# Patient Record
Sex: Male | Born: 1990 | Race: Black or African American | Hispanic: No | Marital: Single | Smoking: Never smoker
Health system: Southern US, Community
[De-identification: ages and names within clinical notes are randomized; demographics above are authoritative.]

## PROBLEM LIST (undated history)

## (undated) DIAGNOSIS — Z8619 Personal history of other infectious and parasitic diseases: Secondary | ICD-10-CM

## (undated) HISTORY — DX: Personal history of other infectious and parasitic diseases: Z86.19

## (undated) HISTORY — PX: NO PAST SURGERIES: SHX2092

---

## 2011-11-17 ENCOUNTER — Emergency Department (HOSPITAL_COMMUNITY)
Admission: EM | Admit: 2011-11-17 | Discharge: 2011-11-17 | Disposition: A | Discharge status: Home / Self Care | Payer: No Typology Code available for payment source | Attending: Emergency Medicine | Admitting: Emergency Medicine

## 2011-11-17 ENCOUNTER — Emergency Department (HOSPITAL_COMMUNITY): Payer: No Typology Code available for payment source

## 2011-11-17 ENCOUNTER — Encounter (HOSPITAL_COMMUNITY): Payer: Self-pay | Admitting: Emergency Medicine

## 2011-11-17 DIAGNOSIS — R51 Headache: Secondary | ICD-10-CM | POA: Insufficient documentation

## 2011-11-17 DIAGNOSIS — Z23 Encounter for immunization: Secondary | ICD-10-CM | POA: Insufficient documentation

## 2011-11-17 DIAGNOSIS — S0180XA Unspecified open wound of other part of head, initial encounter: Secondary | ICD-10-CM | POA: Insufficient documentation

## 2011-11-17 DIAGNOSIS — S0181XA Laceration without foreign body of other part of head, initial encounter: Secondary | ICD-10-CM

## 2011-11-17 DIAGNOSIS — H53149 Visual discomfort, unspecified: Secondary | ICD-10-CM | POA: Insufficient documentation

## 2011-11-17 MED ORDER — FLUORESCEIN SODIUM 1 MG OP STRP
1.0000 | ORAL_STRIP | Freq: Once | OPHTHALMIC | Status: AC
  Administered 2011-11-17: 1 via OPHTHALMIC
  Filled 2011-11-17: qty 1

## 2011-11-17 MED ORDER — HYDROCODONE-ACETAMINOPHEN 5-325 MG PO TABS
1.0000 | ORAL_TABLET | Freq: Once | ORAL | Status: AC
  Administered 2011-11-17: 1 via ORAL
  Filled 2011-11-17: qty 1

## 2011-11-17 MED ORDER — ONDANSETRON 8 MG PO TBDP
8.0000 mg | ORAL_TABLET | Freq: Once | ORAL | Status: AC
  Administered 2011-11-17: 8 mg via ORAL
  Filled 2011-11-17: qty 1

## 2011-11-17 MED ORDER — TETRACAINE HCL 0.5 % OP SOLN
2.0000 [drp] | Freq: Once | OPHTHALMIC | Status: AC
  Administered 2011-11-17: 2 [drp] via OPHTHALMIC

## 2011-11-17 MED ORDER — TETANUS-DIPHTH-ACELL PERTUSSIS 5-2.5-18.5 LF-MCG/0.5 IM SUSP
0.5000 mL | Freq: Once | INTRAMUSCULAR | Status: AC
  Administered 2011-11-17: 0.5 mL via INTRAMUSCULAR
  Filled 2011-11-17: qty 0.5
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
## 2011-11-17 NOTE — ED Notes (Signed)
Report received, airway intact-no s/s's of distress-will continue to monitor 
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
## 2012-01-21 ENCOUNTER — Encounter (HOSPITAL_COMMUNITY): Payer: Self-pay | Admitting: Emergency Medicine

## 2012-01-21 ENCOUNTER — Emergency Department (HOSPITAL_COMMUNITY)
Admission: EM | Admit: 2012-01-21 | Discharge: 2012-01-21 | Disposition: A | Discharge status: Home / Self Care | Payer: Self-pay | Attending: Emergency Medicine | Admitting: Emergency Medicine

## 2012-01-21 DIAGNOSIS — R110 Nausea: Secondary | ICD-10-CM | POA: Insufficient documentation

## 2012-01-21 DIAGNOSIS — K5289 Other specified noninfective gastroenteritis and colitis: Secondary | ICD-10-CM | POA: Insufficient documentation

## 2012-01-21 DIAGNOSIS — R197 Diarrhea, unspecified: Secondary | ICD-10-CM | POA: Insufficient documentation

## 2012-01-21 DIAGNOSIS — K529 Noninfective gastroenteritis and colitis, unspecified: Secondary | ICD-10-CM

## 2012-01-21 LAB — POCT I-STAT, CHEM 8
BUN: 8 mg/dL (ref 6–23)
Calcium, Ion: 1.21 mmol/L (ref 1.12–1.23)
Chloride: 103 mEq/L (ref 96–112)
Creatinine, Ser: 1.2 mg/dL (ref 0.50–1.35)
Glucose, Bld: 86 mg/dL (ref 70–99)
HCT: 41 % (ref 39.0–52.0)
Hemoglobin: 13.9 g/dL (ref 13.0–17.0)
Potassium: 3.5 mEq/L (ref 3.5–5.1)
Sodium: 141 mEq/L (ref 135–145)
TCO2: 28 mmol/L (ref 0–100)

## 2012-01-21 LAB — CBC WITH DIFFERENTIAL/PLATELET
Basophils Absolute: 0.1 10*3/uL (ref 0.0–0.1)
Basophils Relative: 1 % (ref 0–1)
Eosinophils Absolute: 0.3 10*3/uL (ref 0.0–0.7)
Eosinophils Relative: 3 % (ref 0–5)
HCT: 39.2 % (ref 39.0–52.0)
Hemoglobin: 13.3 g/dL (ref 13.0–17.0)
Lymphocytes Relative: 17 % (ref 12–46)
Lymphs Abs: 1.5 10*3/uL (ref 0.7–4.0)
MCH: 28.7 pg (ref 26.0–34.0)
MCHC: 33.9 g/dL (ref 30.0–36.0)
MCV: 84.7 fL (ref 78.0–100.0)
Monocytes Absolute: 1.4 10*3/uL — ABNORMAL HIGH (ref 0.1–1.0)
Monocytes Relative: 16 % — ABNORMAL HIGH (ref 3–12)
Neutro Abs: 5.4 10*3/uL (ref 1.7–7.7)
Neutrophils Relative %: 63 % (ref 43–77)
Platelets: 230 10*3/uL (ref 150–400)
RBC: 4.63 MIL/uL (ref 4.22–5.81)
RDW: 13.2 % (ref 11.5–15.5)
WBC: 8.7 10*3/uL (ref 4.0–10.5)

## 2012-01-21 MED ORDER — HYDROCODONE-ACETAMINOPHEN 5-325 MG PO TABS: 2.0000 | ORAL_TABLET | ORAL | Status: DC | PRN

## 2012-01-21 MED ORDER — DIPHENOXYLATE-ATROPINE 2.5-0.025 MG PO TABS: 1.0000 | ORAL_TABLET | Freq: Four times a day (QID) | ORAL | Status: DC | PRN

## 2012-01-21 NOTE — ED Provider Notes (Addendum)
History     CSN: [id number]  Arrival date & time 01/21/12  1417   First MD Initiated Contact with Patient 01/21/12 1508      Chief Complaint  Patient presents with  . Abdominal Pain  . Diarrhea     HPI Pt from home c/o abdominal cramping and diarrhea x1 weeks. Pt states he can eat and drink, no emesis. Pt reports he has "stomach flu" on Monday, but diarrhea has not gone away. Pt in NAD and A&O  History reviewed. No pertinent past medical history.  History reviewed. No pertinent past surgical history.  No family history on file.  History  Substance Use Topics  . Smoking status: Never Smoker   . Smokeless tobacco: Never Used  . Alcohol Use: Yes     Comment: occasionally      Review of Systems All other systems reviewed and are negative Allergies  Penicillins  Home Medications   Current Outpatient Rx  Name  Route  Sig  Dispense  Refill  . DIPHENOXYLATE-ATROPINE 2.5-0.025 MG PO TABS   Oral   Take 1 tablet by mouth 4 (four) times daily as needed for diarrhea or loose stools.   30 tablet   0   . HYDROCODONE-ACETAMINOPHEN 5-325 MG PO TABS   Oral   Take 2 tablets by mouth every 4 (four) hours as needed for pain.   10 tablet   0     BP 105/66  Pulse 78  Temp 98.7 F (37.1 C) (Oral)  Resp 16  SpO2 100%  Physical Exam  Nursing note and vitals reviewed. Constitutional: He is oriented to person, place, and time. He appears well-developed and well-nourished. No distress.  HENT:  Head: Normocephalic and atraumatic.  Eyes: Pupils are equal, round, and reactive to light.  Neck: Normal range of motion.  Cardiovascular: Normal rate and intact distal pulses.   Pulmonary/Chest: No respiratory distress.  Abdominal: Normal appearance. He exhibits no distension. There is no tenderness. There is no rebound and no guarding.  Musculoskeletal: Normal range of motion.  Neurological: He is alert and oriented to person, place, and time. No cranial nerve deficit.  Skin:  Skin is warm and dry. No rash noted.  Psychiatric: He has a normal mood and affect. His behavior is normal.    ED Course  Procedures (including critical care time)   Labs Reviewed  CBC WITH DIFFERENTIAL  POCT I-STAT, CHEM 8   No results found.   1. Gastroenteritis       MDM         Nelia Shi, MD 01/21/12 1640  Nelia Shi, MD 01/21/12 [phone]

## 2012-01-21 NOTE — ED Notes (Signed)
Pt from home c/o abdominal cramping and diarrhea x1 weeks. Pt states he can eat and drink, no emesis. Pt reports he has "stomach flu" on Monday, but diarrhea has not gone away. Pt in NAD and A&O

## 2012-01-21 NOTE — ED Notes (Addendum)
Pt states he has been having abd cramping/diarrhea since Monday. Was having NV until Tuesday but now is just having nausea.  Pain 10/10.  Pt does not appear to be in any distress.

## 2012-03-03 ENCOUNTER — Encounter (HOSPITAL_COMMUNITY): Payer: Self-pay | Admitting: *Deleted

## 2012-03-03 ENCOUNTER — Emergency Department (HOSPITAL_COMMUNITY)
Admission: EM | Admit: 2012-03-03 | Discharge: 2012-03-03 | Disposition: A | Discharge status: Home / Self Care | Payer: Self-pay | Attending: Emergency Medicine | Admitting: Emergency Medicine

## 2012-03-03 DIAGNOSIS — K5289 Other specified noninfective gastroenteritis and colitis: Secondary | ICD-10-CM | POA: Insufficient documentation

## 2012-03-03 DIAGNOSIS — K529 Noninfective gastroenteritis and colitis, unspecified: Secondary | ICD-10-CM

## 2012-03-03 DIAGNOSIS — R112 Nausea with vomiting, unspecified: Secondary | ICD-10-CM | POA: Insufficient documentation

## 2012-03-03 LAB — LIPASE, BLOOD: Lipase: 11 U/L (ref 11–59)

## 2012-03-03 LAB — URINALYSIS, ROUTINE W REFLEX MICROSCOPIC
Glucose, UA: NEGATIVE mg/dL
Hgb urine dipstick: NEGATIVE
Ketones, ur: >80 mg/dL — AB
Leukocytes, UA: NEGATIVE
Nitrite: NEGATIVE
Protein, ur: 30 mg/dL — AB
Specific Gravity, Urine: 1.035 — ABNORMAL HIGH (ref 1.005–1.030)
Urobilinogen, UA: 1 mg/dL (ref 0.0–1.0)
pH: 5.5 (ref 5.0–8.0)

## 2012-03-03 LAB — CBC WITH DIFFERENTIAL/PLATELET
Basophils Absolute: 0 10*3/uL (ref 0.0–0.1)
Basophils Relative: 0 % (ref 0–1)
Eosinophils Absolute: 0 10*3/uL (ref 0.0–0.7)
Eosinophils Relative: 0 % (ref 0–5)
HCT: 41.7 % (ref 39.0–52.0)
Hemoglobin: 14.3 g/dL (ref 13.0–17.0)
Lymphocytes Relative: 5 % — ABNORMAL LOW (ref 12–46)
Lymphs Abs: 0.6 10*3/uL — ABNORMAL LOW (ref 0.7–4.0)
MCH: 29.5 pg (ref 26.0–34.0)
MCHC: 34.3 g/dL (ref 30.0–36.0)
MCV: 86.2 fL (ref 78.0–100.0)
Monocytes Absolute: 0.6 10*3/uL (ref 0.1–1.0)
Monocytes Relative: 5 % (ref 3–12)
Neutro Abs: 10.1 10*3/uL — ABNORMAL HIGH (ref 1.7–7.7)
Neutrophils Relative %: 90 % — ABNORMAL HIGH (ref 43–77)
Platelets: 220 10*3/uL (ref 150–400)
RBC: 4.84 MIL/uL (ref 4.22–5.81)
RDW: 13.9 % (ref 11.5–15.5)
WBC: 11.3 10*3/uL — ABNORMAL HIGH (ref 4.0–10.5)

## 2012-03-03 LAB — URINE MICROSCOPIC-ADD ON

## 2012-03-03 LAB — COMPREHENSIVE METABOLIC PANEL
ALT: 10 U/L (ref 0–53)
AST: 20 U/L (ref 0–37)
Albumin: 4.3 g/dL (ref 3.5–5.2)
Alkaline Phosphatase: 82 U/L (ref 39–117)
BUN: 18 mg/dL (ref 6–23)
CO2: 21 mEq/L (ref 19–32)
Calcium: 9.3 mg/dL (ref 8.4–10.5)
Chloride: 98 mEq/L (ref 96–112)
Creatinine, Ser: 0.94 mg/dL (ref 0.50–1.35)
GFR calc Af Amer: >90 mL/min (ref >90)
GFR calc non Af Amer: >90 mL/min (ref >90)
Glucose, Bld: 134 mg/dL — ABNORMAL HIGH (ref 70–99)
Potassium: 4.4 mEq/L (ref 3.5–5.1)
Sodium: 134 mEq/L — ABNORMAL LOW (ref 135–145)
Total Bilirubin: 0.6 mg/dL (ref 0.3–1.2)
Total Protein: 8.1 g/dL (ref 6.0–8.3)

## 2012-03-03 MED ORDER — SODIUM CHLORIDE 0.9 % IV BOLUS (SEPSIS)
1000.0000 mL | Freq: Once | INTRAVENOUS | Status: AC
  Administered 2012-03-03: 1000 mL via INTRAVENOUS

## 2012-03-03 MED ORDER — ONDANSETRON HCL 4 MG PO TABS: 4.0000 mg | ORAL_TABLET | Freq: Four times a day (QID) | ORAL | Status: DC

## 2012-03-03 MED ORDER — SODIUM CHLORIDE 0.9 % IV SOLN
Freq: Once | INTRAVENOUS | Status: AC
  Administered 2012-03-03: 03:00:00 via INTRAVENOUS

## 2012-03-03 MED ORDER — ONDANSETRON HCL 4 MG/2ML IJ SOLN
4.0000 mg | Freq: Once | INTRAMUSCULAR | Status: AC
  Administered 2012-03-03: 4 mg via INTRAVENOUS
  Filled 2012-03-03: qty 2

## 2012-03-03 NOTE — ED Provider Notes (Signed)
History     CSN: [id number]  Arrival date & time 03/03/12  0026   First MD Initiated Contact with Patient 03/03/12 0245      Chief Complaint  Patient presents with  . Nausea  . Diarrhea    (Consider location/radiation/quality/duration/timing/severity/associated sxs/prior treatment) Patient is a 22 y.o. male presenting with diarrhea. The history is provided by the patient and a parent. No language interpreter was used.  Diarrhea Quality:  Watery Severity:  Moderate Onset quality:  Sudden Duration:  9 hours Timing:  Intermittent Progression:  Worsening Relieved by:  Nothing Worsened by:  Nothing tried Ineffective treatments:  None tried Associated symptoms: vomiting   Associated symptoms: no abdominal pain, no chills, no recent cough and no fever   Risk factors: suspect food intake    22 yo male who ate at [facility] and shortly after that had diarrhea and vomiting > 15 times since 5pm yesterday.  No sick contacts. Unable to eat anything since.  Denies fever or abdominal pain.   History reviewed. No pertinent past medical history.  History reviewed. No pertinent past surgical history.  History reviewed. No pertinent family history.  History  Substance Use Topics  . Smoking status: Never Smoker   . Smokeless tobacco: Never Used  . Alcohol Use: Yes     Comment: occasionally      Review of Systems  Constitutional: Negative.  Negative for fever and chills.  HENT: Negative.   Eyes: Negative.   Respiratory: Negative.  Negative for shortness of breath.   Cardiovascular: Negative.  Negative for chest pain.  Gastrointestinal: Positive for nausea, vomiting and diarrhea. Negative for abdominal pain, constipation and blood in stool.  Genitourinary: Negative for hematuria.  Neurological: Negative.   Psychiatric/Behavioral: Negative.   All other systems reviewed and are negative.    Allergies  Penicillins  Home Medications   Current Outpatient Rx  Name  Route  Sig   Dispense  Refill  . diphenoxylate-atropine (LOMOTIL) 2.5-0.025 MG per tablet   Oral   Take 1 tablet by mouth 4 (four) times daily as needed for diarrhea or loose stools.   30 tablet   0     BP 106/67  Pulse 82  Temp(Src) 98.9 F (37.2 C)  Resp 16  Ht 6\' 1"  (1.854 m)  SpO2 100%  Physical Exam  Nursing note and vitals reviewed. Constitutional: He is oriented to person, place, and time. He appears well-developed and well-nourished.  HENT:  Head: Normocephalic.  Eyes: Conjunctivae and EOM are normal. Pupils are equal, round, and reactive to light.  Neck: Normal range of motion. Neck supple.  Cardiovascular: Normal rate.   Pulmonary/Chest: Effort normal.  Abdominal: Soft. Bowel sounds are normal. He exhibits no distension. There is no tenderness. There is no rebound.  Musculoskeletal: Normal range of motion.  Neurological: He is alert and oriented to person, place, and time.  Skin: Skin is warm and dry.  Psychiatric: He has a normal mood and affect.    ED Course  Procedures (including critical care time)  Labs Reviewed  URINALYSIS, ROUTINE W REFLEX MICROSCOPIC - Abnormal; Notable for the following:    Color, Urine AMBER (*)    APPearance CLOUDY (*)    Specific Gravity, Urine 1.035 (*)    Bilirubin Urine SMALL (*)    Ketones, ur >80 (*)    Protein, ur 30 (*)    All other components within normal limits  CBC WITH DIFFERENTIAL - Abnormal; Notable for the following:  WBC 11.3 (*)    Neutrophils Relative 90 (*)    Neutro Abs 10.1 (*)    Lymphocytes Relative 5 (*)    Lymphs Abs 0.6 (*)    All other components within normal limits  COMPREHENSIVE METABOLIC PANEL - Abnormal; Notable for the following:    Sodium 134 (*)    Glucose, Bld 134 (*)    All other components within normal limits  LIPASE, BLOOD  URINE MICROSCOPIC-ADD ON   No results found.   No diagnosis found.    MDM  Gastroenteritis resolved in the ER .  Tolerating po's.  NS bolus and zofran with good  results.   Labs Reviewed  URINALYSIS, ROUTINE W REFLEX MICROSCOPIC - Abnormal; Notable for the following:    Color, Urine AMBER (*)    APPearance CLOUDY (*)    Specific Gravity, Urine 1.035 (*)    Bilirubin Urine SMALL (*)    Ketones, ur >80 (*)    Protein, ur 30 (*)    All other components within normal limits  CBC WITH DIFFERENTIAL - Abnormal; Notable for the following:    WBC 11.3 (*)    Neutrophils Relative 90 (*)    Neutro Abs 10.1 (*)    Lymphocytes Relative 5 (*)    Lymphs Abs 0.6 (*)    All other components within normal limits  COMPREHENSIVE METABOLIC PANEL - Abnormal; Notable for the following:    Sodium 134 (*)    Glucose, Bld 134 (*)    All other components within normal limits  LIPASE, BLOOD  URINE MICROSCOPIC-ADD ON          Remi Haggard, NP 03/03/12 [phone]

## 2012-03-03 NOTE — ED Notes (Signed)
Pt. Tolerated PO intake, denies N/V.

## 2012-03-04 NOTE — ED Provider Notes (Signed)
Medical screening examination/treatment/procedure(s) were performed by non-physician practitioner and as supervising physician I was immediately available for consultation/collaboration.  Sunnie Nielsen, MD 03/04/12 [phone]

## 2012-10-19 ENCOUNTER — Emergency Department (HOSPITAL_COMMUNITY)
Admission: EM | Admit: 2012-10-19 | Discharge: 2012-10-19 | Disposition: A | Discharge status: Home / Self Care | Payer: Self-pay | Attending: Emergency Medicine | Admitting: Emergency Medicine

## 2012-10-19 ENCOUNTER — Encounter (HOSPITAL_COMMUNITY): Payer: Self-pay | Admitting: Emergency Medicine

## 2012-10-19 DIAGNOSIS — L02519 Cutaneous abscess of unspecified hand: Secondary | ICD-10-CM | POA: Insufficient documentation

## 2012-10-19 DIAGNOSIS — Z880 Allergy status to penicillin: Secondary | ICD-10-CM | POA: Insufficient documentation

## 2012-10-19 DIAGNOSIS — L02512 Cutaneous abscess of left hand: Secondary | ICD-10-CM

## 2012-10-19 DIAGNOSIS — L0231 Cutaneous abscess of buttock: Secondary | ICD-10-CM | POA: Insufficient documentation

## 2012-10-19 DIAGNOSIS — R509 Fever, unspecified: Secondary | ICD-10-CM | POA: Insufficient documentation

## 2012-10-19 DIAGNOSIS — R110 Nausea: Secondary | ICD-10-CM | POA: Insufficient documentation

## 2012-10-19 MED ORDER — SULFAMETHOXAZOLE-TRIMETHOPRIM 800-160 MG PO TABS: 1.0000 | ORAL_TABLET | Freq: Two times a day (BID) | ORAL | Status: DC

## 2012-10-19 MED ORDER — DIAZEPAM 5 MG PO TABS
10.0000 mg | ORAL_TABLET | Freq: Once | ORAL | Status: AC
  Administered 2012-10-19: 10 mg via ORAL
  Filled 2012-10-19: qty 2

## 2012-10-19 MED ORDER — HYDROCODONE-ACETAMINOPHEN 5-325 MG PO TABS: 1.0000 | ORAL_TABLET | ORAL | Status: DC | PRN

## 2012-10-19 NOTE — ED Provider Notes (Signed)
Medical screening examination/treatment/procedure(s) were conducted as a shared visit with non-physician practitioner(s) and myself.  I personally evaluated the patient during the encounter  Pt with abscess to hand on left, also one to left gluteus.  Left abscess was raised, erythematous, fluctuant, tender.  Decision made to perform I&D at bedside.  Please se PAC procedure note for details.  No fever, not septic appearing.    Michael Mclaughlin. Beatris Belen, MD 10/19/12 1420

## 2012-10-19 NOTE — ED Notes (Signed)
He has red, swollen area on medial left hand, and another on left buttock, which he theorizes is from a spider.  He states he may've had a low grade fever, and otherwise feels well.

## 2012-10-19 NOTE — ED Provider Notes (Signed)
CSN: [id number]     Arrival date & time 10/19/12  1229 History This chart was scribed for non-physician practitioner working with Gavin Pound. Oletta Lamas, MD by Ashley Jacobs, ED scribe. This patient was seen in room WTR6/WTR6 and the patient's care was started at 12:51 PM.    First MD Initiated Contact with Patient 10/19/12 1249     Chief Complaint  Patient presents with  . Insect Bite   (Consider location/radiation/quality/duration/timing/severity/associated sxs/prior Treatment) The history is provided by the patient and medical records. No language interpreter was used.   HPI Comments: Michael Mclaughlin is a 22 y.o. male who presents to the Emergency Department complaining of insect bites on his left buttock and lateral left hand for the past two days.  Pt reports having painful discomfort and swelling surrounding the sites. He also reports a fever of 99.2 taken orally and nausea as associated symptoms. Pt denies having diabetes or having any other medical complication. He specifically denies IVDU or hx of immunosuppressive therapy.  He also denies vomiting and headache.  Pt is allergic to penicillins. He does not smoke tobacco but he does drink alcohol occasionally. Prior to arrival pt tried Benadryl with no relief.   History reviewed. No pertinent past medical history. History reviewed. No pertinent past surgical history. History reviewed. No pertinent family history. History  Substance Use Topics  . Smoking status: Never Smoker   . Smokeless tobacco: Never Used  . Alcohol Use: Yes     Comment: occasionally    Review of Systems  Constitutional: Positive for fever. Negative for chills.  Gastrointestinal: Positive for nausea. Negative for vomiting.  Skin: Positive for wound (left buttock and left hand).  Neurological: Negative for headaches.  All other systems reviewed and are negative.    Allergies  Penicillins  Home Medications   Current Outpatient Rx  Name  Route  Sig  Dispense   Refill  . HYDROcodone-acetaminophen (NORCO/VICODIN) 5-325 MG per tablet   Oral   Take 1-2 tablets by mouth every 4 (four) hours as needed for pain.   15 tablet   0   . sulfamethoxazole-trimethoprim (SEPTRA DS) 800-160 MG per tablet   Oral   Take 1 tablet by mouth every 12 (twelve) hours.   20 tablet   0    BP 117/67  Pulse 82  Temp(Src) 98.9 F (37.2 C) (Oral)  Wt 125 lb (56.7 kg)  BMI 16.5 kg/m2  SpO2 100% Physical Exam  Nursing note and vitals reviewed. Constitutional: He is oriented to person, place, and time. He appears well-developed and well-nourished. No distress.  HENT:  Head: Normocephalic and atraumatic.  Eyes: Conjunctivae are normal. No scleral icterus.  Neck: Normal range of motion.  Cardiovascular: Normal rate, regular rhythm, normal heart sounds and intact distal pulses.   No murmur heard. Capillary refill less than 3 seconds  Pulmonary/Chest: Effort normal and breath sounds normal. No respiratory distress. He has no wheezes.  Abdominal: Soft. He exhibits no distension. There is no tenderness.  Musculoskeletal:  Full range of motion of all joints of the left hand  Lymphadenopathy:    He has no cervical adenopathy.  Neurological: He is alert and oriented to person, place, and time.  Sensation intact and strength 5 out of 5 in the left hand including strong grip strength  Skin: Skin is warm and dry. He is not diaphoretic. There is erythema.  3 x 3 cm of induration and erythema to the dorsum of the left hand with extending erythema over  the entire dorsum of the hand  6 x 6 cm area of induration and erythema to the left buttock with extending erythema over the majority of the left buttock. small area of fluctuance  Psychiatric: He has a normal mood and affect.    ED Course  INCISION AND DRAINAGE Date/Time: 10/19/2012 1:40 PM Performed by: Dierdre Forth Authorized by: Dierdre Forth Consent: Verbal consent obtained. Risks and benefits:  risks, benefits and alternatives were discussed Consent given by: patient Patient understanding: patient states understanding of the procedure being performed Patient consent: the patient's understanding of the procedure matches consent given Procedure consent: procedure consent matches procedure scheduled Relevant documents: relevant documents present and verified Site marked: the operative site was marked Required items: required blood products, implants, devices, and special equipment available Patient identity confirmed: verbally with patient and arm band Time out: Immediately prior to procedure a "time out" was called to verify the correct patient, procedure, equipment, support staff and site/side marked as required. Type: abscess Body area: upper extremity Location details: left hand Anesthesia: local infiltration Local anesthetic: lidocaine 2% without epinephrine Anesthetic total: 3 ml Patient sedated: no Scalpel size: 11 Incision type: single straight Complexity: complex Drainage: purulent Drainage amount: moderate Packing material: none Patient tolerance: Patient tolerated the procedure well with no immediate complications.  INCISION AND DRAINAGE Date/Time: 10/19/2012 1:41 PM Performed by: Dierdre Forth Authorized by: Dierdre Forth Consent: Verbal consent obtained. Risks and benefits: risks, benefits and alternatives were discussed Consent given by: patient Patient understanding: patient states understanding of the procedure being performed Patient consent: the patient's understanding of the procedure matches consent given Procedure consent: procedure consent matches procedure scheduled Relevant documents: relevant documents present and verified Site marked: the operative site was marked Required items: required blood products, implants, devices, and special equipment available Patient identity confirmed: verbally with patient and arm band Time out:  Immediately prior to procedure a "time out" was called to verify the correct patient, procedure, equipment, support staff and site/side marked as required. Type: abscess Body area: lower extremity Location details: left buttock Anesthesia: local infiltration Local anesthetic: lidocaine 2% without epinephrine Anesthetic total: 2.5 ml Patient sedated: no Scalpel size: 11 Incision type: single straight Complexity: complex Drainage: purulent Drainage amount: copious Wound treatment: wound left open Packing material: none Patient tolerance: Patient tolerated the procedure well with no immediate complications.  [place] bedside Date/Time: 10/19/2012 1:15 PM Performed by: Dierdre Forth Authorized by: Dierdre Forth Consent: Verbal consent obtained. Risks and benefits: risks, benefits and alternatives were discussed Consent given by: patient Patient understanding: patient states understanding of the procedure being performed Patient consent: the patient's understanding of the procedure matches consent given Procedure consent: procedure consent matches procedure scheduled Relevant documents: relevant documents present and verified Required items: required blood products, implants, devices, and special equipment available Patient identity confirmed: verbally with patient and arm band Time out: Immediately prior to procedure a "time out" was called to verify the correct patient, procedure, equipment, support staff and site/side marked as required. Preparation: Patient was prepped and draped in the usual sterile fashion. Local anesthesia used: no Patient sedated: no Patient tolerance: Patient tolerated the procedure well with no immediate complications. Comments: Ultrasound of left hand abscess identified fluid collection - large fluid collection of the left hand abscess.   (including critical care time) DIAGNOSTIC STUDIES: Oxygen Saturation is 100% on room air, normal by my  interpretation.    COORDINATION OF CARE: 12:58 PM Discussed course of care with pt . Pt understands and  agrees.  Labs Review Labs Reviewed - No data to display Imaging Review No results found.  EKG Interpretation   None       MDM   1. Abscess of hand, left   2. Abscess of left buttock      Harlin Rain presents with 2 discrete abscesses. One of the left hand one on the left buttock.  Left hand abscess verified via ultrasound.  Patient with skin abscesses amenable to incision and drainage.  Neither abscess was large enough to warrant packing or drain, wound recheck in 2 days either here in the department or with hand surgery. Encouraged home warm soaks and flushing.  Mild signs of cellulitis is surrounding skin.  Will d/c to home with Bactrim.    It has been determined that no acute conditions requiring further emergency intervention are present at this time. The patient/guardian have been advised of the diagnosis and plan. We have discussed signs and symptoms that warrant return to the ED, such as changes or worsening in symptoms.   Vital signs are stable at discharge.   BP 117/67  Pulse 82  Temp(Src) 98.9 F (37.2 C) (Oral)  Wt 125 lb (56.7 kg)  BMI 16.5 kg/m2  SpO2 100%  Patient/guardian has voiced understanding and agreed to follow-up with the PCP or specialist.    I personally performed the services described in this documentation, which was scribed in my presence. The recorded information has been reviewed and is accurate.   Dahlia Client Trelon Plush, PA-C 10/19/12 1350
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
## 2013-11-26 ENCOUNTER — Ambulatory Visit (INDEPENDENT_AMBULATORY_CARE_PROVIDER_SITE_OTHER): Payer: No Typology Code available for payment source | Admitting: Physician Assistant

## 2013-11-26 ENCOUNTER — Encounter: Payer: Self-pay | Admitting: Physician Assistant

## 2013-11-26 VITALS — BP 118/73 | HR 60 | Temp 98.3°F | Resp 16 | Ht 73.0 in | Wt 129.5 lb

## 2013-11-26 DIAGNOSIS — Z880 Allergy status to penicillin: Secondary | ICD-10-CM

## 2013-11-26 NOTE — Assessment & Plan Note (Signed)
Will obtain IgE for penicillins.  However I will also set up patient with Allergist for skin testing or graded drug challenge.

## 2013-11-26 NOTE — Progress Notes (Signed)
Pre visit review using our clinic review tool, if applicable. No additional management support is needed unless otherwise documented below in the visit note/SLS  

## 2013-11-26 NOTE — Progress Notes (Signed)
   Patient presents to clinic today to establish care.  Acute Concerns: Patient requesting testing to confirm/deny possibly penicillin allergy.  Health Maintenance: Dental -- up-to-date Vision -- up-to-date Immunizations -- [facility]  Past Medical History  Diagnosis Date  . History of sexually transmitted disease     Past Surgical History  Procedure Laterality Date  . No past surgeries      No current outpatient prescriptions on file prior to visit.   No current facility-administered medications on file prior to visit.    Allergies  Allergen Reactions  . Penicillins Swelling    Family History  Problem Relation Age of Onset  . Asthma Father     Living  . Diabetes Mother     Borderline-Living  . Healthy Brother     x1    History   Social History  . Marital Status: Single    Spouse Name: N/A    Number of Children: N/A  . Years of Education: N/A   Occupational History  . Cook    Social History Main Topics  . Smoking status: Never Smoker   . Smokeless tobacco: Never Used  . Alcohol Use: 0.0 oz/week    0 Not specified per week     Comment: occasionally  . Drug Use: No  . Sexual Activity:    Partners: Male     Comment: condom   Other Topics Concern  . Not on file   Social History Narrative   ROS All pertinent ROS are listed in HPI.  BP 118/73 mmHg  Pulse 60  Temp(Src) 98.3 F (36.8 C) (Oral)  Resp 16  Ht 6\' 1"  (1.854 m)  Wt 129 lb 8 oz (58.741 kg)  BMI 17.09 kg/m2  SpO2 100%  Physical Exam  Constitutional: He is oriented to person, place, and time and well-developed, well-nourished, and in no distress.  HENT:  Head: Normocephalic and atraumatic.  Right Ear: External ear normal.  Left Ear: External ear normal.  Nose: Nose normal.  Mouth/Throat: Oropharynx is clear and moist. No oropharyngeal exudate.  TM within normal limits bilaterally.  Eyes: Conjunctivae are normal. Pupils are equal, round, and reactive to light.   Neck: Neck supple.  Cardiovascular: Normal rate, regular rhythm, normal heart sounds and intact distal pulses.   Pulmonary/Chest: Effort normal and breath sounds normal. No respiratory distress. He has no wheezes. He has no rales. He exhibits no tenderness.  Neurological: He is alert and oriented to person, place, and time.  Skin: Skin is warm. No rash noted.  Psychiatric: Affect normal.  Vitals reviewed.  Assessment/Plan: Allergy to penicillin Will obtain IgE for penicillins.  However I will also set up patient with Allergist for skin testing or graded drug challenge.

## 2013-11-27 LAB — ALLERGEN PENICILLIN G IGE: PENICILLIN G IGE ALLERGEN: <0.1 kU/L

## 2013-12-02 ENCOUNTER — Telehealth: Payer: Self-pay | Admitting: *Deleted

## 2013-12-02 NOTE — Telephone Encounter (Signed)
Spoke with the pt and informed him of recent lab results and note below.  Pt verbalized understanding and agreed to see an Allergist.  Informed the pt that he will receive a call regarding the Allergist appt.//AB/CMA

## 2013-12-02 NOTE — Telephone Encounter (Signed)
-----   Message from Waldon MerlWilliam C Martin, PA-C sent at 11/28/2013  7:09 AM EST ----- Blood testing shows no level of IgE to Penicillin G which lowers concern for an allergy to the medication.  Still recommend the Allergist evaluation as that testing is much more accurate.

## 2013-12-03 ENCOUNTER — Encounter: Payer: Self-pay | Admitting: Physician Assistant

## 2014-01-26 ENCOUNTER — Encounter (INDEPENDENT_AMBULATORY_CARE_PROVIDER_SITE_OTHER): Payer: Self-pay

## 2014-01-26 ENCOUNTER — Ambulatory Visit (INDEPENDENT_AMBULATORY_CARE_PROVIDER_SITE_OTHER): Payer: BLUE CROSS/BLUE SHIELD | Admitting: Internal Medicine

## 2014-01-26 ENCOUNTER — Other Ambulatory Visit: Payer: BLUE CROSS/BLUE SHIELD

## 2014-01-26 ENCOUNTER — Encounter: Payer: Self-pay | Admitting: Internal Medicine

## 2014-01-26 VITALS — BP 120/82 | HR 73 | Ht 74.0 in | Wt 126.8 lb

## 2014-01-26 DIAGNOSIS — J302 Other seasonal allergic rhinitis: Secondary | ICD-10-CM

## 2014-01-26 DIAGNOSIS — Z880 Allergy status to penicillin: Secondary | ICD-10-CM

## 2014-01-26 NOTE — Patient Instructions (Addendum)
Order lab-  For dx seasonal allergic rhinitis, penicillin allergy Allergy profile  Penicilloyl G IgE Penicilloyl V IgE Amoxacillin IgE Ampicillin IgE

## 2014-01-26 NOTE — Assessment & Plan Note (Signed)
Many histories of poorly defined allergic reaction to old penicillin from 20 or 30 years ago cannot be confirmed with current testing. Plan-IgE testing for penicillins, amoxicillin, ampicillin.

## 2014-01-26 NOTE — Progress Notes (Signed)
[birth year] 23 yoM nonsmoker   Referred courtesy of Francee Gentile, [facility]; for Allergy evaluation Gwynneth Fabio man in good general health asks allergy evaluation primarily because his mother had told him as a Ondine Gemme child he had some kind of reaction to penicillin. He does not consider himself an allergic person except for nasal congestion sneezing and watery nose and eyes especially in the spring time, treated with Benadryl. He denies history of asthma, urticaria or drug rash, specific food intolerance or unusual reaction to insect stings, aspirin or latex. A brother is also said to be allergic to penicillin with no specific details. Penicillin G IgE was normal.  Prior to Admission medications   Not on File   Past Medical History  Diagnosis Date  . History of sexually transmitted disease    Past Surgical History  Procedure Laterality Date  . No past surgeries     Family History  Problem Relation Age of Onset  . Asthma Father     Living  . Diabetes Mother     Borderline-Living  . Healthy Brother     x1   History   Social History  . Marital Status: Single    Spouse Name: N/A    Number of Children: 0  . Years of Education: N/A   Occupational History  . Cook    Social History Main Topics  . Smoking status: Never Smoker   . Smokeless tobacco: Never Used  . Alcohol Use: 0.0 oz/week    0 Not specified per week     Comment: occasionally  . Drug Use: No  . Sexual Activity:    Partners: Male     Comment: condom   Other Topics Concern  . Not on file   Social History Narrative   ROS-see HPI Constitutional:   No-   weight loss, night sweats, fevers, chills, fatigue, lassitude. HEENT:   No-  headaches, difficulty swallowing, tooth/dental problems, sore throat,       No-  sneezing, itching, ear ache, nasal congestion, post nasal drip,  CV:  No-   chest pain, orthopnea, PND, swelling in lower extremities, anasarca,                                  dizziness, palpitations Resp: No-    shortness of breath with exertion or at rest.              No-   productive cough,  No non-productive cough,  No- coughing up of blood.              No-   change in color of mucus.  No- wheezing.   Skin: No-   rash or lesions. GI:  No-   heartburn, indigestion, abdominal pain, nausea, vomiting, diarrhea,                 change in bowel habits, loss of appetite GU: No-   dysuria, change in color of urine, no urgency or frequency.  No- flank pain. MS:  No-   joint pain or swelling.  No- decreased range of motion.  No- back pain. Neuro-     nothing unusual Psych:  No- change in mood or affect. No depression or anxiety.  No memory loss.  OBJ- Physical Exam General- Alert, Oriented, Affect-appropriate, Distress- none acute, slender Skin- rash-none, lesions- none, excoriation- none Lymphadenopathy- none Head- atraumatic  Eyes- Gross vision intact, PERRLA, conjunctivae and secretions clear            Ears- Hearing, canals-normal            Nose- Clear, no-Septal dev, mucus, polyps, erosion, perforation             Throat- Mallampati II-III , mucosa clear , drainage- none, tonsils- atrophic Neck- flexible , trachea midline, no stridor , thyroid nl, carotid no bruit Chest - symmetrical excursion , unlabored           Heart/CV- RRR , no murmur , no gallop  , no rub, nl s1 s2                           - JVD- none , edema- none, stasis changes- none, varices- none           Lung- clear to P&A, wheeze- none, cough- none , dullness-none, rub- none           Chest wall-  Abd- tender-no, distended-no, bowel sounds-present, HSM- no Br/ Gen/ Rectal- Not done, not indicated Extrem- cyanosis- none, clubbing, none, atrophy- none, strength- nl Neuro- grossly intact to observation

## 2014-01-26 NOTE — Assessment & Plan Note (Signed)
History of seasonal rhinitis, mainly springtime Plan-allergy profile. We discussed choice of antihistamines

## 2014-01-27 LAB — ALLERGY FULL PROFILE
Allergen, D pternoyssinus,d7: 1.37 kU/L — ABNORMAL HIGH
Allergen,Goose feathers, e70: <0.1 kU/L
Alternaria Alternata: <0.1 kU/L
Aspergillus fumigatus, m3: <0.1 kU/L
Bahia Grass: 0.7 kU/L — ABNORMAL HIGH
Bermuda Grass: 0.15 kU/L — ABNORMAL HIGH
Box Elder IgE: <0.1 kU/L
Candida Albicans: 0.3 kU/L — ABNORMAL HIGH
Cat Dander: <0.1 kU/L
Common Ragweed: <0.1 kU/L
Curvularia lunata: <0.1 kU/L
D. farinae: 1.86 kU/L — ABNORMAL HIGH
Dog Dander: <0.1 kU/L
Elm IgE: <0.1 kU/L
Fescue: 2.23 kU/L — ABNORMAL HIGH
G005 Rye, Perennial: 2.27 kU/L — ABNORMAL HIGH
G009 Red Top: 2.29 kU/L — ABNORMAL HIGH
Goldenrod: <0.1 kU/L
Helminthosporium halodes: <0.1 kU/L
House Dust Hollister: <0.1 kU/L
IgE (Immunoglobulin E), Serum: 18 kU/L (ref <115)
Lamb's Quarters: <0.1 kU/L
Oak: <0.1 kU/L
Plantain: <0.1 kU/L
Stemphylium Botryosum: <0.1 kU/L
Sycamore Tree: <0.1 kU/L
Timothy Grass: 1.93 kU/L — ABNORMAL HIGH

## 2014-01-27 LAB — ALLERGEN PENICILLIN V (MINOR): Allergen Penicillin V (Minor): <0.1 kU/L

## 2014-01-27 LAB — ALLERGEN PENICILLIN G IGE: PENICILLIN G IGE ALLERGEN: <0.1 kU/L

## 2014-01-29 LAB — ALLERGEN AMOXICILLIN
Amoxicillin IgE Class: 0
Amoxicillin IgE kU/L: <0.1 (ref <0.10)

## 2014-01-29 LAB — ALLERGEN AMPICILLIN
Ampicillin IgE Class: 0
Ampicillin IgE: <0.1 (ref <0.10)

## 2014-02-03 ENCOUNTER — Telehealth: Payer: Self-pay | Admitting: Internal Medicine

## 2014-02-03 NOTE — Telephone Encounter (Signed)
Spoke with pt, he is aware of results.  Nothing further needed. 

## 2014-02-03 NOTE — Telephone Encounter (Signed)
Pt returned call [phone]

## 2014-02-03 NOTE — Telephone Encounter (Signed)
Result Note     Allergy antibodies were not found for penicillin or its relatives ampicillin and amoxacillin. That makes chance pretty remote of sudden, life-threatening allergic reaction to any of these meds.    Environmental allergy profile did show antibodies suggesting potential allergy to house dust mites and to Spring grass pollens      LMTCB

## 2014-02-16 ENCOUNTER — Ambulatory Visit: Payer: BLUE CROSS/BLUE SHIELD | Admitting: Physician Assistant

## 2017-01-15 DIAGNOSIS — H5213 Myopia, bilateral: Secondary | ICD-10-CM | POA: Diagnosis not present

## 2017-07-19 ENCOUNTER — Encounter: Payer: Self-pay | Admitting: Family Medicine

## 2017-07-19 ENCOUNTER — Other Ambulatory Visit: Payer: Self-pay

## 2017-07-19 ENCOUNTER — Ambulatory Visit (INDEPENDENT_AMBULATORY_CARE_PROVIDER_SITE_OTHER): Payer: BLUE CROSS/BLUE SHIELD | Admitting: Family Medicine

## 2017-07-19 VITALS — BP 104/80 | HR 70 | Temp 98.2°F | Ht 74.0 in | Wt 128.6 lb

## 2017-07-19 DIAGNOSIS — R51 Headache: Secondary | ICD-10-CM

## 2017-07-19 DIAGNOSIS — Z202 Contact with and (suspected) exposure to infections with a predominantly sexual mode of transmission: Secondary | ICD-10-CM

## 2017-07-19 DIAGNOSIS — R519 Headache, unspecified: Secondary | ICD-10-CM

## 2017-07-19 DIAGNOSIS — G8929 Other chronic pain: Secondary | ICD-10-CM | POA: Insufficient documentation

## 2017-07-19 LAB — CBC
HCT: 45 % (ref 39.0–52.0)
Hemoglobin: 15.2 g/dL (ref 13.0–17.0)
MCHC: 33.7 g/dL (ref 30.0–36.0)
MCV: 86.6 fl (ref 78.0–100.0)
Platelets: 154 10*3/uL (ref 150.0–400.0)
RBC: 5.2 Mil/uL (ref 4.22–5.81)
RDW: 13.9 % (ref 11.5–15.5)
WBC: 5.2 10*3/uL (ref 4.0–10.5)

## 2017-07-19 LAB — BASIC METABOLIC PANEL
BUN: 14 mg/dL (ref 6–23)
CO2: 27 mEq/L (ref 19–32)
Calcium: 9.7 mg/dL (ref 8.4–10.5)
Chloride: 104 mEq/L (ref 96–112)
Creatinine, Ser: 1.01 mg/dL (ref 0.40–1.50)
GFR: 113.74 mL/min (ref >60.00)
Glucose, Bld: 98 mg/dL (ref 70–99)
Potassium: 3.7 mEq/L (ref 3.5–5.1)
Sodium: 139 mEq/L (ref 135–145)

## 2017-07-19 LAB — TSH: TSH: 2.32 u[IU]/mL (ref 0.35–4.50)

## 2017-07-19 NOTE — Assessment & Plan Note (Signed)
Recurrent headaches likely related to analgesic rebound, discussed limiting tylenol use.  May want to switch to NSAID instead.  Remain well hydrated Will check additional labs given history of syphilis, although it sounds that this was adequately treated.

## 2017-07-19 NOTE — Progress Notes (Signed)
Michael Mclaughlin - 27 y.o. male MRN [id number]  Date of birth: March 12, 1990  Subjective Chief Complaint  Patient presents with  . Establish Care    ate sausage biscuit at 8 am but would like bloodwork bc he has been having headaches for last 2-3 weeks.    HPI Michael Mclaughlin is a 27 y.o. male here today to establish with new pcp. He has complaint of headaches.  Reports that he has had headaches and migraines in the past but these were fairly infrequent.  Over the past two weeks he has had headache nearly every day.  He has been taking tylenol daily for this which helps the headaches but then returns the next day.  Headaches are located in the R temporal area and has some pain in the back of his neck .  He denies nausea or vomiting, vision changes, tinnitus, stiff neck, fever, chills.  He does have a history of syphilis but [facility]  He does not believe he has had HIV exposure.  He does use condoms with intercourse.    ROS:  ROS completed and negative except as noted per HPI Allergies  Allergen Reactions  . Penicillins Swelling    Past Medical History:  Diagnosis Date  . History of sexually transmitted disease   . [facility]    [facility]     Past Surgical History:  Procedure Laterality Date  . NO PAST SURGERIES      Social History   Socioeconomic History  . Marital status: Single    Spouse name: Not on file  . Number of children: 0  . Years of education: Not on file  . Highest education level: Not on file  Occupational History  . Occupation: [employer]  . Financial resource strain: Not on file  . Food insecurity:    Worry: Not on file    Inability: Not on file  . Transportation needs:    Medical: Not on file    Non-medical: Not on file  Tobacco Use  . Smoking status: Never Smoker  . Smokeless tobacco: Never Used  Substance and Sexual Activity  . Alcohol use: Yes    Alcohol/week: 0.0  oz    Comment: occasionally  . Drug use: No  . Sexual activity: Yes    Partners: Male    Comment: condom  Lifestyle  . Physical activity:    Days per week: Not on file    Minutes per session: Not on file  . Stress: Not on file  Relationships  . Social connections:    Talks on phone: Not on file    Gets together: Not on file    Attends religious service: Not on file    Active member of club or organization: Not on file    Attends meetings of clubs or organizations: Not on file    Relationship status: Not on file  Other Topics Concern  . Not on file  Social History Narrative  . Not on file    Family History  Problem Relation Age of Onset  . Asthma Father        Living  . Diabetes Mother        Borderline-Living  . Healthy Brother        x1    Health Maintenance  Topic Date Due  . HIV Screening  04/15/2005  . INFLUENZA VACCINE  08/09/2017  . TETANUS/TDAP  11/16/2021    -----------------------------------------------------------------------------------------------------------------------------------------------------------------------------------------------------------------  Physical Exam BP 104/80 (BP Location: Left Arm, Patient Position: Sitting, Cuff Size: Normal)   Pulse 70   Temp 98.2 F (36.8 C) (Oral)   Ht 6\' 2"  (1.88 m)   Wt 128 lb 9.6 oz (58.3 kg)   SpO2 97%   BMI 16.51 kg/m   Physical Exam  Constitutional: He is oriented to person, place, and time. He appears well-nourished. No distress.  HENT:  Head: Normocephalic and atraumatic.  Mouth/Throat: Oropharynx is clear and moist.  Eyes: Pupils are equal, round, and reactive to light. EOM are normal. No scleral icterus.  Neck: Normal range of motion. Neck supple. No thyromegaly present.  No nuchal rigidity.   Cardiovascular: Normal rate, regular rhythm and normal heart sounds.  Pulmonary/Chest: Effort normal and breath sounds normal.  Musculoskeletal: Normal range of motion. He exhibits no edema.    Lymphadenopathy:    He has no cervical adenopathy.  Neurological: He is alert and oriented to person, place, and time. No cranial nerve deficit. He exhibits normal muscle tone. Coordination normal.  Skin: No rash noted.  Psychiatric: He has a normal mood and affect. His behavior is normal.    ------------------------------------------------------------------------------------------------------------------------------------------------------------------------------------------------------------------- Assessment and Plan  Chronic nonintractable headache Recurrent headaches likely related to analgesic rebound, discussed limiting tylenol use.  May want to switch to NSAID instead.  Remain well hydrated Will check additional labs given history of syphilis, although it sounds that this was adequately treated.

## 2017-07-19 NOTE — Patient Instructions (Signed)
Analgesic Rebound Headache An analgesic rebound headache, sometimes called a medication overuse headache, is a headache that comes after pain medicine (analgesic) taken to treat the original (primary) headache has worn off. Any type of primary headache can return as a rebound headache if a person regularly takes analgesics more than three times a week to treat it. The types of primary headaches that are commonly associated with rebound headaches include:  Migraines.  Headaches that arise from tense muscles in the head and neck area (tension headaches).  Headaches that develop and happen again (recur) on one side of the head and around the eye (cluster headaches).  If rebound headaches continue, they become chronic daily headaches. What are the causes? This condition may be caused by frequent use of:  Over-the-counter medicines such as aspirin, ibuprofen, and acetaminophen.  Sinus relief medicines and other medicines that contain caffeine.  Narcotic pain medicines such as codeine and oxycodone.  What are the signs or symptoms? The symptoms of a rebound headache are the same as the symptoms of the original headache. Some of the symptoms of specific types of headaches include: Migraine headache  Pulsing or throbbing pain on one or both sides of the head.  Severe pain that interferes with daily activities.  Pain that is worsened by physical activity.  Nausea, vomiting, or both.  Pain with exposure to bright light, loud noises, or strong smells.  General sensitivity to bright light, loud noises, or strong smells.  Visual changes.  Numbness of one or both arms. Tension headache  Pressure around the head.  Dull, aching head pain.  Pain felt over the front and sides of the head.  Tenderness in the muscles of the head, neck, and shoulders. Cluster headache  Severe pain that begins in or around one eye or temple.  Redness and tearing in the eye on the same side as the  pain.  Droopy or swollen eyelid.  One-sided head pain.  Nausea.  Runny nose.  Sweaty, pale facial skin.  Restlessness. How is this diagnosed? This condition is diagnosed by:  Reviewing your medical history. This includes the nature of your primary headaches.  Reviewing the types of pain medicines that you have been using to treat your headaches and how often you take them.  How is this treated? This condition may be treated or managed by:  Discontinuing frequent use of the analgesic medicine. Doing this may worsen your headaches at first, but the pain should eventually become more manageable, less frequent, and less severe.  Seeing a headache specialist. He or she may be able to help you manage your headaches and help make sure there is not another cause of the headaches.  Using methods of stress relief, such as acupuncture, counseling, biofeedback, and massage. Talk with your health care provider about which methods might be good for you.  Follow these instructions at home:  Take over-the-counter and prescription medicines only as told by your health care provider.  Stop the repeated use of pain medicine as told by your health care provider. Stopping can be difficult. Carefully follow instructions from your health care provider.  Avoid triggers that are known to cause your primary headaches.  Keep all follow-up visits as told by your health care provider. This is important. Contact a health care provider if:  You continue to experience headaches after following treatments that your health care provider recommended. Get help right away if:  You develop new headache pain.  You develop headache pain that is different   than what you have experienced in the past.  You develop numbness or tingling in your arms or legs.  You develop changes in your speech or vision. This information is not intended to replace advice given to you by your health care provider. Make sure you  discuss any questions you have with your health care provider. Document Released: 03/18/2003 Document Revised: 07/16/2015 Document Reviewed: 05/31/2015 Elsevier Interactive Patient Education  2018 [employer]  

## 2017-07-20 LAB — HIV ANTIBODY (ROUTINE TESTING W REFLEX): HIV 1&2 Ab, 4th Generation: NONREACTIVE

## 2017-07-20 LAB — FLUORESCENT TREPONEMAL AB(FTA)-IGG-BLD: Fluorescent Treponemal ABS: REACTIVE — AB

## 2017-07-20 LAB — RPR TITER: RPR Titer: 1:2 {titer} — ABNORMAL HIGH

## 2017-07-20 LAB — RPR: RPR Ser Ql: REACTIVE — AB

## 2017-07-25 NOTE — Progress Notes (Signed)
-  RPR (syphilis test) remains reactive but shows that it was adequately treated. -Other labs are normal.

## 2017-08-02 DIAGNOSIS — R112 Nausea with vomiting, unspecified: Secondary | ICD-10-CM | POA: Diagnosis not present

## 2017-11-02 DIAGNOSIS — Z23 Encounter for immunization: Secondary | ICD-10-CM | POA: Diagnosis not present

## 2018-03-13 ENCOUNTER — Ambulatory Visit (INDEPENDENT_AMBULATORY_CARE_PROVIDER_SITE_OTHER): Payer: BLUE CROSS/BLUE SHIELD | Admitting: Family Medicine

## 2018-03-13 ENCOUNTER — Encounter: Payer: Self-pay | Admitting: Family Medicine

## 2018-03-13 VITALS — BP 122/90 | HR 96 | Temp 98.2°F | Resp 18 | Ht 74.0 in | Wt 123.4 lb

## 2018-03-13 DIAGNOSIS — R55 Syncope and collapse: Secondary | ICD-10-CM

## 2018-03-13 LAB — CBC
HCT: 45.4 % (ref 39.0–52.0)
Hemoglobin: 15.3 g/dL (ref 13.0–17.0)
MCHC: 33.6 g/dL (ref 30.0–36.0)
MCV: 86 fl (ref 78.0–100.0)
Platelets: 181 10*3/uL (ref 150.0–400.0)
RBC: 5.28 Mil/uL (ref 4.22–5.81)
RDW: 13.9 % (ref 11.5–15.5)
WBC: 4.8 10*3/uL (ref 4.0–10.5)

## 2018-03-13 LAB — BASIC METABOLIC PANEL
BUN: 17 mg/dL (ref 6–23)
CO2: 30 mEq/L (ref 19–32)
Calcium: 9.8 mg/dL (ref 8.4–10.5)
Chloride: 100 mEq/L (ref 96–112)
Creatinine, Ser: 0.92 mg/dL (ref 0.40–1.50)
GFR: 118.61 mL/min (ref >60.00)
Glucose, Bld: 86 mg/dL (ref 70–99)
Potassium: 4.1 mEq/L (ref 3.5–5.1)
Sodium: 137 mEq/L (ref 135–145)

## 2018-03-13 NOTE — Patient Instructions (Signed)
We'll be in touch with lab results Be sure to eat and drink regularly throughout the day If this occurs again please let me know.

## 2018-03-13 NOTE — Progress Notes (Signed)
Michael Mclaughlin - 28 y.o. male MRN [id number]  Date of birth: [birth year]  Subjective Chief Complaint  Patient presents with  . Tachycardia    pt expierenced these sxs yesterday during, EKG was done today, around 1  . Shortness of Breath  . Blurred Vision  . Dizziness    HPI Michael Mclaughlin is a 28 y.o. male here today for symptoms of tachycardia.  He was at work yesterday and had symptoms of palpitations, mild shortness of breath and feeling like he was about to pass out.  He did not experience syncope and has felt well since. He admits to sometimes missing mills or not staying well hydrated during the day.  He had and EKG completed at work and was told to f/u with urgent care or PCP.  EKG he had completed read by computer as "sinus rhythm with L atrial enlargement and non-specific T wave abnormality"  There is no sign of arrhythmia or signs of AV re-entry.  PR and QTc intervals are normal.   ROS:  A comprehensive ROS was completed and negative except as noted per HPI  No Active Allergies  Past Medical History:  Diagnosis Date  . History of sexually transmitted disease   . [facility]    [facility]     Past Surgical History:  Procedure Laterality Date  . NO PAST SURGERIES      Social History   Socioeconomic History  . Marital status: Single    Spouse name: Not on file  . Number of children: 0  . Years of education: Not on file  . Highest education level: Not on file  Occupational History  . Occupation: [employer]  . Financial resource strain: Not on file  . Food insecurity:    Worry: Not on file    Inability: Not on file  . Transportation needs:    Medical: Not on file    Non-medical: Not on file  Tobacco Use  . Smoking status: Never Smoker  . Smokeless tobacco: Never Used  Substance and Sexual Activity  . Alcohol use: Yes    Alcohol/week: 0.0 standard drinks    Comment: occasionally  . Drug use: No  . Sexual activity: Yes   Partners: Male    Comment: condom  Lifestyle  . Physical activity:    Days per week: Not on file    Minutes per session: Not on file  . Stress: Not on file  Relationships  . Social connections:    Talks on phone: Not on file    Gets together: Not on file    Attends religious service: Not on file    Active member of club or organization: Not on file    Attends meetings of clubs or organizations: Not on file    Relationship status: Not on file  Other Topics Concern  . Not on file  Social History Narrative  . Not on file    Family History  Problem Relation Age of Onset  . Asthma Father        Living  . Diabetes Mother        Borderline-Living  . Healthy Brother        x1    Health Maintenance  Topic Date Due  . Janet Berlin  11/16/2021  . INFLUENZA VACCINE  Completed  . HIV Screening  Completed    ----------------------------------------------------------------------------------------------------------------------------------------------------------------------------------------------------------------- Physical Exam BP 122/90 (BP Location: Right Arm, Patient Position: Sitting, Cuff Size: Normal)   Pulse 96  Temp 98.2 F (36.8 C) (Oral)   Resp 18   Ht 6\' 2"  (1.88 m)   Wt 123 lb 6.4 oz (56 kg)   SpO2 97%   BMI 15.84 kg/m   Physical Exam Constitutional:      Appearance: Normal appearance. He is well-developed. He is not ill-appearing.  HENT:     Head: Normocephalic and atraumatic.     Nose: Nose normal.     Mouth/Throat:     Mouth: Mucous membranes are moist.  Eyes:     General: No scleral icterus. Neck:     Musculoskeletal: Neck supple.  Cardiovascular:     Rate and Rhythm: Normal rate and regular rhythm.     Heart sounds: Normal heart sounds.  Pulmonary:     Effort: Pulmonary effort is normal.     Breath sounds: Normal breath sounds.  Skin:    General: Skin is warm and dry.     Findings: No rash.  Neurological:     General: No focal deficit  present.     Mental Status: He is alert.  Psychiatric:        Mood and Affect: Mood normal.     ------------------------------------------------------------------------------------------------------------------------------------------------------------------------------------------------------------------- Assessment and Plan  Pre-syncope -EKG reviewed and relatively normal for his age, discussed with him.  -Discussed being sure to eat and drink regularly throughout the day.  -Check bmp and cbc today

## 2018-03-13 NOTE — Assessment & Plan Note (Signed)
-  EKG reviewed and relatively normal for his age, discussed with him.  -Discussed being sure to eat and drink regularly throughout the day.  -Check bmp and cbc today

## 2018-09-16 ENCOUNTER — Encounter: Payer: Self-pay | Admitting: Family Medicine

## 2018-11-06 DIAGNOSIS — Z23 Encounter for immunization: Secondary | ICD-10-CM | POA: Diagnosis not present

## 2018-12-28 ENCOUNTER — Encounter: Payer: Self-pay | Admitting: Family Medicine

## 2018-12-31 NOTE — Telephone Encounter (Signed)
Source Subject Topic Michael Mclaughlin, Michael Mclaughlin (Patient) Michael Mclaughlin, Michael Mclaughlin (Patient) Appointment Scheduling - Scheduling Inquiry for Clinic Summary: Vaccine request Reason for CRM: MMR and TD vaccine request for school,  Best contact: [phone]  Dr. Matthews pt sent another request MMR and Td shot for school. I advised the pt that according to our record he got tdap back 11/17/2011--from ED so the only thing he needs is MMR--according to [facility]--he received this back in 01/14/1992.   Please advise ok for adult MMR injection in the office?  Also need to print out copy for immunization during nurse visit.   

## 2019-01-01 NOTE — Telephone Encounter (Signed)
Has he only had 1 mmr vaccine?

## 2019-01-02 NOTE — Telephone Encounter (Signed)
I called and spoke to patient. Patient scheduled nurse visit appointment for MMR for 01/14/2018.

## 2019-01-09 ENCOUNTER — Telehealth: Payer: Self-pay

## 2019-01-09 NOTE — Telephone Encounter (Signed)
Copied from [place] [id number]. Topic: General - Other >> Jan 09, 2019  8:51 AM Leward Quan A wrote: Reason for CRM: Patient called to inquire of Dr Zigmund Daniel would it be a good idea for him to get the covid vaccine. Please call patient with an answer his job request that he find out from his PCP before making any decision. Ph#  [phone]

## 2019-01-13 NOTE — Telephone Encounter (Signed)
Yes, I would recommend that he receive vaccine when he is eligible.

## 2019-01-13 NOTE — Telephone Encounter (Signed)
Mychart msg sent

## 2019-01-14 ENCOUNTER — Other Ambulatory Visit: Payer: Self-pay

## 2019-01-15 ENCOUNTER — Telehealth: Payer: Self-pay | Admitting: Family Medicine

## 2019-01-15 ENCOUNTER — Ambulatory Visit (INDEPENDENT_AMBULATORY_CARE_PROVIDER_SITE_OTHER): Payer: BC Managed Care – PPO

## 2019-01-15 ENCOUNTER — Other Ambulatory Visit: Payer: Self-pay | Admitting: Family Medicine

## 2019-01-15 DIAGNOSIS — Z0184 Encounter for antibody response examination: Secondary | ICD-10-CM

## 2019-01-15 NOTE — Telephone Encounter (Signed)
Pt came in to get MMR shot today, pt inform me that he received Covid vaccine on left arm yesterday 01/14/2019 at his work place [employer]) and scheduled to get 2nd dose on 02/11/2019 with no side effects. Pt stated deadline to get MMR done is 01/28/2019 for [facility] students.   Spoke with the nurse, Vaughan Basta, from [facility] [phone] and they request letter from Dr. Zigmund Daniel stating the fact that pt just received Covid Vaccin on 01/14/2019 and will received 2nd dose on 02/11/2019. Recommendation for MMR is to wait 28 days after the 2nd dose of Covid vaccine. Please fax it attention to Vaughan Basta [phone] before 01/28/2019 in order to put the patient on pending.   Dr. Zigmund Daniel please advise, pt request the letter.

## 2019-01-15 NOTE — Progress Notes (Signed)
Open in error

## 2019-01-16 ENCOUNTER — Encounter: Payer: Self-pay | Admitting: Family Medicine

## 2019-01-16 NOTE — Telephone Encounter (Signed)
Sent mychart inform the pt.

## 2019-01-16 NOTE — Telephone Encounter (Signed)
Letter sent via my chart

## 2019-03-07 ENCOUNTER — Telehealth: Payer: Self-pay | Admitting: Family Medicine

## 2019-03-07 NOTE — Telephone Encounter (Signed)
Scheduled PT 2nd MMR shot. Please review and make sure everything is correct.

## 2019-03-07 NOTE — Telephone Encounter (Signed)
Patient hasn't been seen here. He needs appt to establish with Dr Ashley Royalty and he can get injection then, or he needs to have series completed at Dr Ashley Royalty previous office

## 2019-03-12 ENCOUNTER — Ambulatory Visit: Payer: BC Managed Care – PPO

## 2019-03-12 ENCOUNTER — Ambulatory Visit: Payer: BC Managed Care – PPO | Admitting: Family Medicine

## 2019-03-21 ENCOUNTER — Other Ambulatory Visit: Payer: Self-pay

## 2019-03-21 ENCOUNTER — Ambulatory Visit (INDEPENDENT_AMBULATORY_CARE_PROVIDER_SITE_OTHER): Payer: BC Managed Care – PPO | Admitting: Family Medicine

## 2019-03-21 ENCOUNTER — Encounter: Payer: Self-pay | Admitting: Family Medicine

## 2019-03-21 VITALS — BP 127/79 | HR 71 | Temp 98.3°F | Ht 74.0 in | Wt 134.0 lb

## 2019-03-21 DIAGNOSIS — Z7189 Other specified counseling: Secondary | ICD-10-CM

## 2019-03-21 DIAGNOSIS — Z23 Encounter for immunization: Secondary | ICD-10-CM | POA: Diagnosis not present

## 2019-03-21 NOTE — Assessment & Plan Note (Signed)
MMR vaccine given today.  Counseling provided.  F/u PRN.

## 2019-03-21 NOTE — Progress Notes (Signed)
Michael Mclaughlin - 29 y.o. male MRN [id number]  Date of birth: December 29, 1990  Subjective Chief Complaint  Patient presents with  . Immunizations    HPI Michael Mclaughlin is a 29 y.o. male with history of syphilis s/p adequate treatment here today for 2nd MMR.  Had titers showing no immunity to MMR previously and only 1 MMR shot as a child.  His immunization was delayed recently due to COVID vaccine. He denies other concerns today.   ROS:  A comprehensive ROS was completed and negative except as noted per HPI     No Active Allergies  Past Medical History:  Diagnosis Date  . History of sexually transmitted disease   . History of [facility]philis    [facility]     Past Surgical History:  Procedure Laterality Date  . NO PAST SURGERIES      Social History   Socioeconomic History  . Marital status: Single    Spouse name: Not on file  . Number of children: 0  . Years of education: Not on file  . Highest education level: Not on file  Occupational History  . Occupation: Cook  Tobacco Use  . Smoking status: Never Smoker  . Smokeless tobacco: Never Used  Substance and Sexual Activity  . Alcohol use: Yes    Alcohol/week: 0.0 standard drinks    Comment: occasionally  . Drug use: No  . Sexual activity: Yes    Partners: Male    Comment: condom  Other Topics Concern  . Not on file  Social History Narrative  . Not on file   Social Determinants of Health   Financial Resource Strain:   . Difficulty of Paying Living Expenses:   Food Insecurity:   . Worried About Charity fundraiser in the Last Year:   . Arboriculturist in the Last Year:   Transportation Needs:   . Film/video editor (Medical):   Marland Kitchen Lack of Transportation (Non-Medical):   Physical Activity:   . Days of Exercise per Week:   . Minutes of Exercise per Session:   Stress:   . Feeling of Stress :   Social Connections:   . Frequency of Communication with Friends and Family:   . Frequency of Social Gatherings  with Friends and Family:   . Attends Religious Services:   . Active Member of Clubs or Organizations:   . Attends Archivist Meetings:   Marland Kitchen Marital Status:     Family History  Problem Relation Age of Onset  . Asthma Father        Living  . Diabetes Mother        Borderline-Living  . Healthy Brother        x1    Health Maintenance  Topic Date Due  . Samul Dada  11/16/2021  . INFLUENZA VACCINE  Completed  . HIV Screening  Completed     ----------------------------------------------------------------------------------------------------------------------------------------------------------------------------------------------------------------- Physical Exam BP 127/79   Pulse 71   Temp 98.3 F (36.8 C) (Oral)   Ht '6\' 2"'  (1.88 m)   Wt 134 lb (60.8 kg)   BMI 17.20 kg/m   Physical Exam Constitutional:      Appearance: Normal appearance.  Skin:    General: Skin is warm and dry.  Neurological:     General: No focal deficit present.     Mental Status: He is alert.  Psychiatric:        Mood and Affect: Mood normal.  Behavior: Behavior normal.     ------------------------------------------------------------------------------------------------------------------------------------------------------------------------------------------------------------------- Assessment and Plan  Need for MMR vaccine MMR vaccine given today.  Counseling provided.  F/u PRN.    No orders of the defined types were placed in this encounter.   No follow-ups on file.    This visit occurred during the SARS-CoV-2 public health emergency.  Safety protocols were in place, including screening questions prior to the visit, additional usage of staff PPE, and extensive cleaning of exam room while observing appropriate contact time as indicated for disinfecting solutions.

## 2019-09-18 DIAGNOSIS — Z1159 Encounter for screening for other viral diseases: Secondary | ICD-10-CM | POA: Diagnosis not present

## 2019-10-31 ENCOUNTER — Encounter: Payer: Self-pay | Admitting: Family Medicine
